# Patient Record
Sex: Female | Born: 1970 | Race: White | Hispanic: Yes | Marital: Married | Smoking: Never smoker
Health system: Southern US, Community
[De-identification: ages and names within clinical notes are randomized; demographics above are authoritative.]

---

## 2009-07-07 ENCOUNTER — Encounter: Payer: Self-pay | Admitting: Obstetrics & Gynecology

## 2009-07-07 ENCOUNTER — Ambulatory Visit: Payer: Self-pay | Admitting: Obstetrics & Gynecology

## 2009-07-07 ENCOUNTER — Inpatient Hospital Stay (HOSPITAL_COMMUNITY): Admission: AD | Admit: 2009-07-07 | Discharge: 2009-07-07 | Payer: Self-pay | Admitting: Obstetrics & Gynecology

## 2009-07-07 LAB — CONVERTED CEMR LAB
Rh Type: POSITIVE
hCG, Beta Chain, Quant, S: 11882.3 milliintl units/mL

## 2009-07-08 ENCOUNTER — Ambulatory Visit: Payer: Self-pay | Admitting: Obstetrics & Gynecology

## 2010-07-25 ENCOUNTER — Encounter: Payer: Self-pay | Admitting: Obstetrics & Gynecology

## 2010-09-19 LAB — CBC
HCT: 37.7 % (ref 36.0–46.0)
Hemoglobin: 12.5 g/dL (ref 12.0–15.0)
MCHC: 33.1 g/dL (ref 30.0–36.0)
MCV: 87.6 fL (ref 78.0–100.0)
RBC: 4.31 MIL/uL (ref 3.87–5.11)
WBC: 9.8 10*3/uL (ref 4.0–10.5)

## 2010-09-19 LAB — HCG, QUANTITATIVE, PREGNANCY: hCG, Beta Chain, Quant, S: 8461 m[IU]/mL — ABNORMAL HIGH (ref <5)

## 2010-11-16 NOTE — Assessment & Plan Note (Signed)
NAME:  Dorothy Ortega, Dorothy Ortega NO.:  [id number]   MEDICAL RECORD NO.:  [id number]          PATIENT TYPE:  POB   LOCATION:  [facility] at [place]         FACILITY:  [facility]   PHYSICIAN:  Allie Bossier, MD        DATE OF BIRTH:  [birth year]   DATE OF SERVICE:  07/08/2009                                  CLINIC NOTE   [name] is a 40 year old married white gravida 3, para 2, abortus 1, who  was seen here yesterday by Dr. Macon Large.  She was having bleeding in  early pregnancy.  HCG drawn here was 11,000.  She then went to the [facility]U  last night and was seen by Dr. Humberto Seals.  Quantitative beta drawn  there was 8000.  An ultrasound done last night showed a gestational sac  low in the uterine segment.  There was no embryo visualized.  She  understands that she is in a process of having a miscarriage.  I offered  her a watchful waiting, Cytotec, and/or a D and C.  At this time, she  elects to proceed with watchful waiting.  I have given her bleeding  precautions.  Her blood type is A+.  She will come back in 6 weeks for a  repeat quantitative beta HCG or sooner if necessary.  Since she may want  to have a pregnancy in the future, she is instructed to continue her  prenatal vitamins with folic acid and I have given her the reason behind  this.      Allie Bossier, MD     MCD/MEDQ  D:  07/08/2009  T:  07/08/2009  Job:  [id number]

## 2010-11-16 NOTE — Assessment & Plan Note (Signed)
Dorothy Ortega, Dorothy Ortega NO.:  [id number]   MEDICAL RECORD NO.:  [id number]          PATIENT TYPE:  POB   LOCATION:  [facility] at [place]         FACILITY:  [facility]   PHYSICIAN:  Jaynie Collins, MD     DATE OF BIRTH:  [birth year]   DATE OF SERVICE:  07/07/2009                                  CLINIC NOTE   CHIEF COMPLAINT:  Bleeding in early pregnancy.   HISTORY OF PRESENT ILLNESS:  The patient is a 40 year old gravida 3,  para 2-0-0-2 with last menstrual period of 05/15/2009, making her's  about 7 weeks and 3 days gestation who is here today with a complaint of  having some bleeding since yesterday.  The patient had a positive  pregnancy test at her primary care physician's office last week and had  no other symptoms until she started having bleeding yesterday evening.  She just reports passing dark brown mucus and some small clots, denies  passage of any tissue.  The patient also had some back pain and mild  abdominal pain that was associated with this bleeding, but currently has  no tenderness or any other concerns.   PHYSICAL EXAMINATION:  VITAL SIGNS:  Blood pressure is 131/72, weight is  158 pounds, height 68 inches.  GENERAL:  In no apparent distress.  ABDOMEN:  Soft, nontender, nondistended.  PELVIC:  Normal external female genitalia.  Pink, well rugated vagina.  The patient did have a small amount of blood and mucus in her posterior  fornix, which was cleared using about 3 Scopettes.  Her cervical os is  closed.  No active bleeding noted.  The patient is nontender on bimanual  examination.   Bedside ultrasound showed possible pregnancy in her uterus.  However,  using the focus and resolution on this ultrasound, it was unable to be  identified as a gestational sac versus a fetal pole as no distinct heart  beat could be visualized.  The patient will have to be sent for formal  ultrasonography.   ASSESSMENT/PLAN:  The patient is a 40 year old gravida 3, para  2, here  for her bleeding and early pregnancy.  The diagnosis of threatened  miscarriage.  The patient was told that it is very difficult to know at  this point based on her examination and ultrasound findings, however,  given  that she is going to have a formal ultrasound, we will follow up  on the results.  We will also check her blood type to see if she is a  candidate for RhoGAM and also check her beta HCG level.  Depending on  what is seen on a formal ultrasound, she might have to come back in 48  hours for another beta HCG level to see the trend.  The patient does  understand that if it is a miscarriage that is ongoing at this point  that there is no intervention that can be done.  She was told that we  will follow up on these results and act accordingly.  Bleeding  precautions were strictly reviewed.           ______________________________  Jaynie Collins, MD     UA/MEDQ  D:  07/07/2009  T:  07/08/2009  Job:  [id number]

## 2011-10-17 IMAGING — US US OB COMP LESS 14 WK
1 series · 14 of 28 positions shown · non-contrast
Comparison: None.

07/08/2009 - DUPLICATE COPY for exam association in [facility] – No change from original report.
CLINICAL DATA: Vaginal bleeding. 7-week-3-day gestational age by
 LMP.

 OBSTETRIC <14 WK US AND TRANSVAGINAL OB US
TECHNIQUE: Both transabdominal and transvaginal ultrasound
 examinations were performed for complete evaluation of the
 gestation as well as the maternal uterus, adnexal regions, and
 pelvic cul-de-sac.

[Series 1: us ob comp less 14 wks · 0.17mm/px · 14 of 39 slices shown]
[im 2/39]
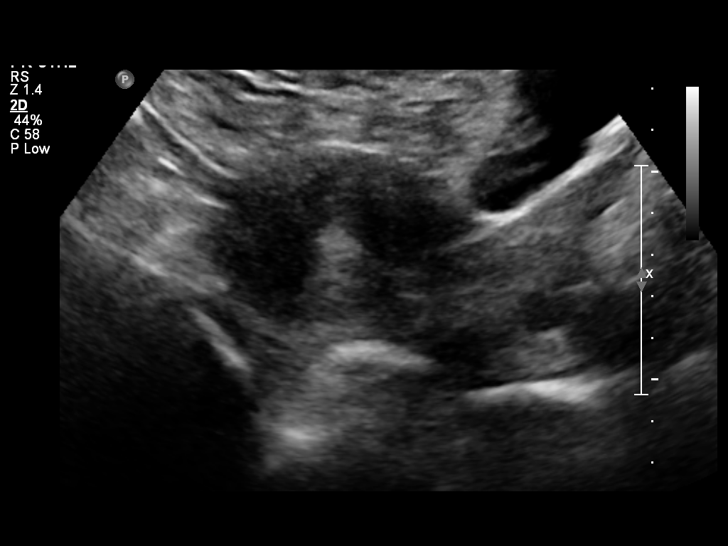
[im 5/39]
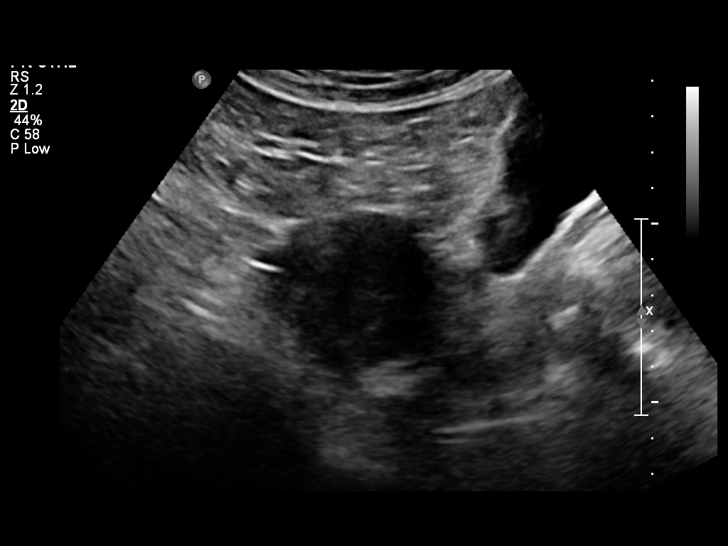
[im 8/39]
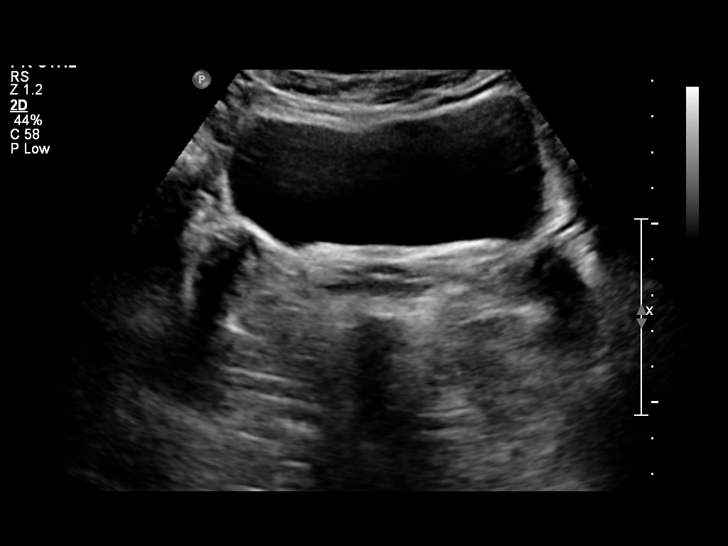
[im 10/39]
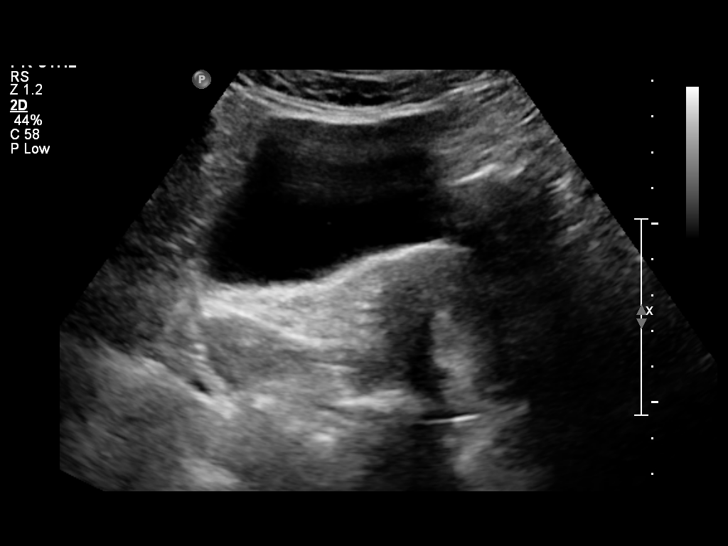
[im 13/39]
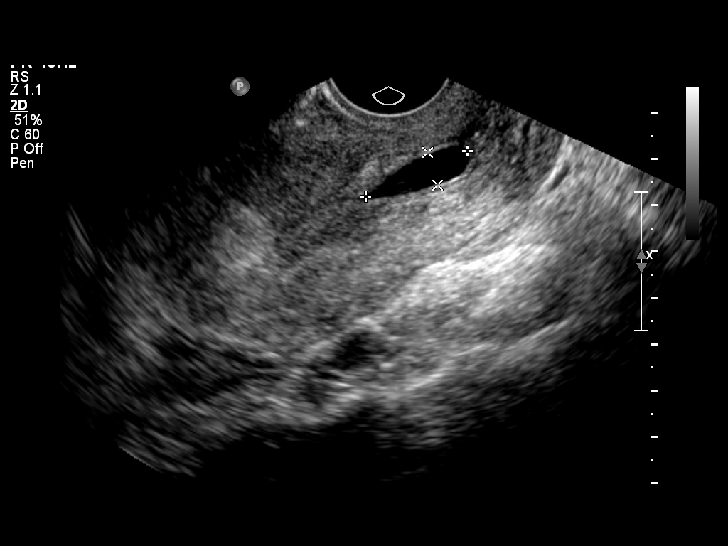
[im 16/39]
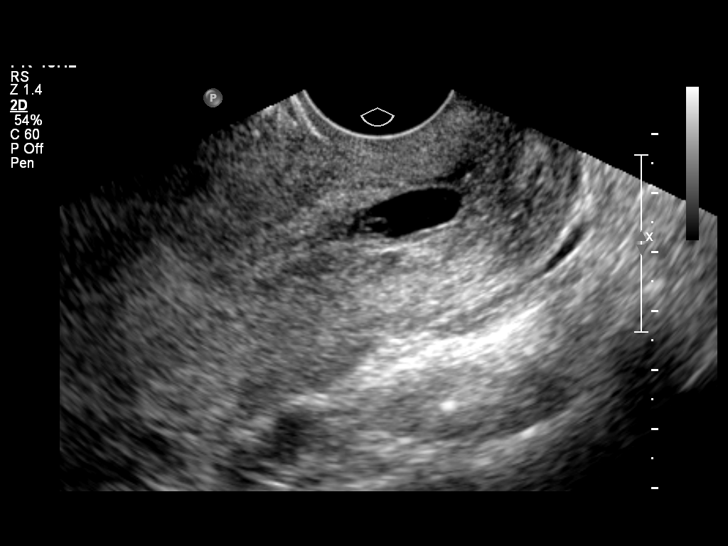
[im 19/39]
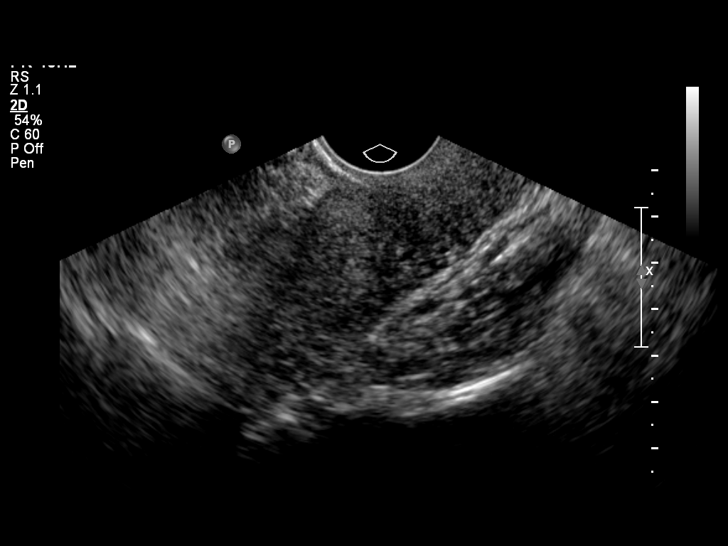
[im 22/39]
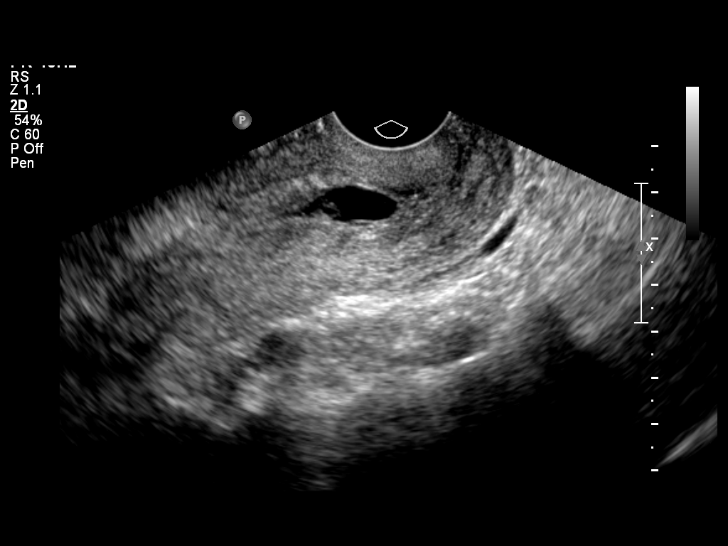
[im 24/39]
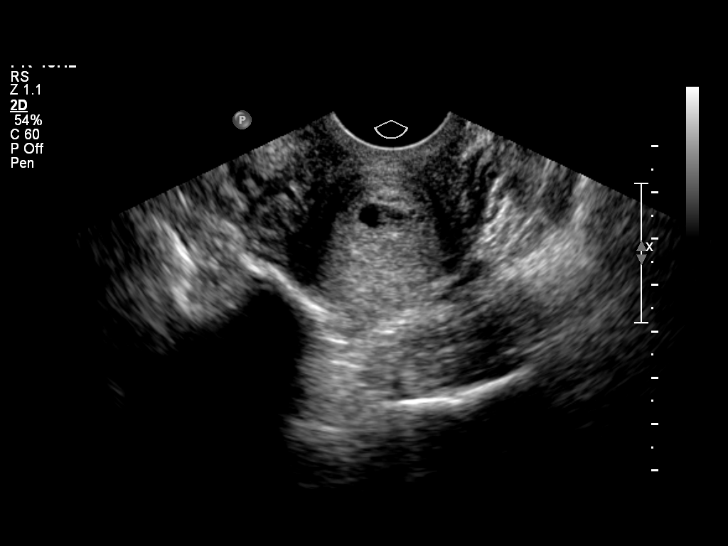
[im 27/39]
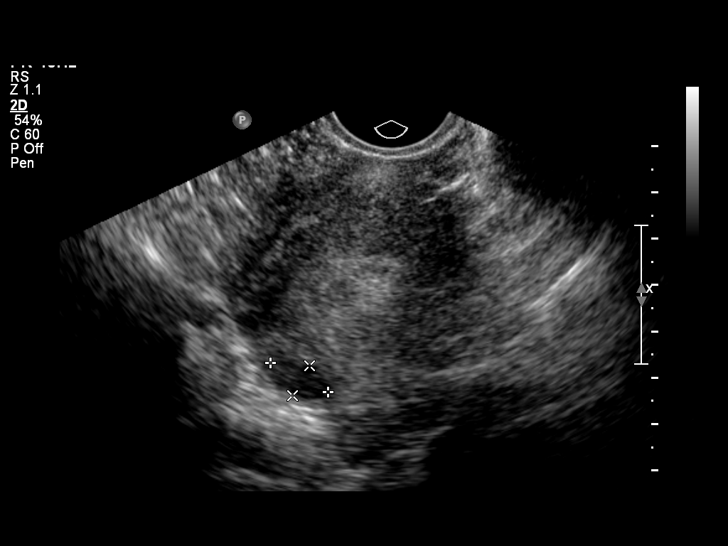
[im 30/39]
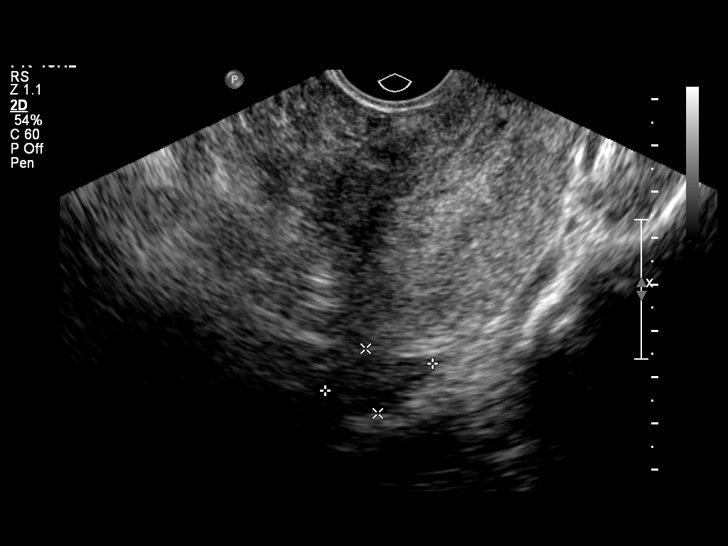
[im 33/39]
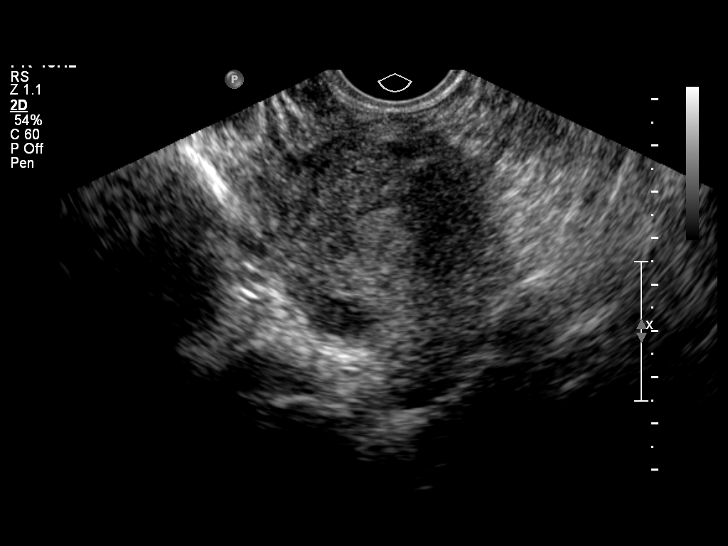
[im 36/39]
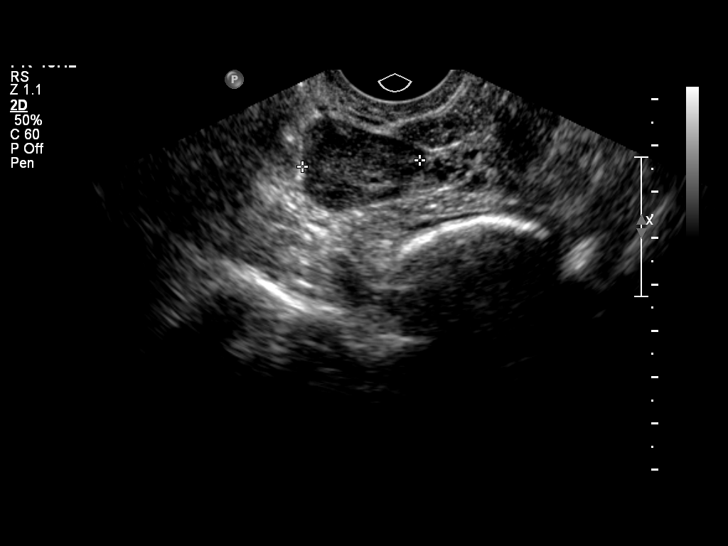
[im 39/39]
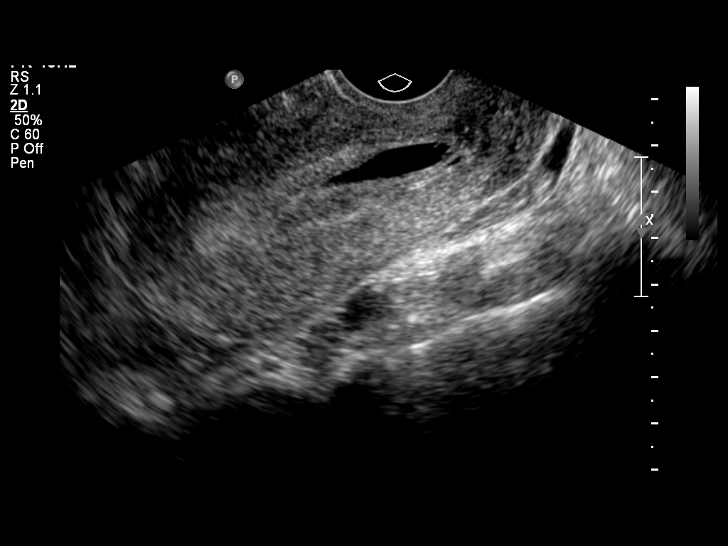

[14 of 28 positions shown; findings below may reference images not displayed]

Intrauterine gestational sac: Single sac located in the lower
 uterine segment
 Yolk sac: Visualized
 Embryo: Not visualized
 Cardiac Activity: Not visualized

 MSD: 15 mm 7 w 0 d

 Maternal uterus/adnexae:
 A single small fibroid is seen in the posterior fundus measuring
 1.4 cm in maximum diameter. Both ovaries are normal appearance.
 No evidence of adnexal mass or free fluid.
IMPRESSION: 1. Single 7-week intrauterine gestational sac in the lower uterine
 segment. Probable spontaneous abortion in progress. Recommend
 follow-up by ultrasound in 10 days if clinically warranted.
 2. 1.4 cm fundal fibroid.
 3. No evidence of adnexal mass or free fluid.

## 2016-08-05 DIAGNOSIS — L309 Dermatitis, unspecified: Secondary | ICD-10-CM | POA: Insufficient documentation

## 2017-11-09 ENCOUNTER — Ambulatory Visit: Payer: Self-pay | Admitting: Podiatry

## 2020-07-29 ENCOUNTER — Other Ambulatory Visit: Payer: Self-pay

## 2020-07-29 ENCOUNTER — Encounter: Payer: Self-pay | Admitting: Family Medicine

## 2020-07-29 ENCOUNTER — Ambulatory Visit (INDEPENDENT_AMBULATORY_CARE_PROVIDER_SITE_OTHER): Payer: 59 | Admitting: Family Medicine

## 2020-07-29 VITALS — BP 137/81 | HR 74 | Temp 97.4°F | Ht 68.0 in | Wt 170.0 lb

## 2020-07-29 DIAGNOSIS — Z13220 Encounter for screening for lipoid disorders: Secondary | ICD-10-CM

## 2020-07-29 DIAGNOSIS — Z8639 Personal history of other endocrine, nutritional and metabolic disease: Secondary | ICD-10-CM

## 2020-07-29 DIAGNOSIS — E559 Vitamin D deficiency, unspecified: Secondary | ICD-10-CM

## 2020-07-29 DIAGNOSIS — Z1211 Encounter for screening for malignant neoplasm of colon: Secondary | ICD-10-CM

## 2020-07-29 DIAGNOSIS — L659 Nonscarring hair loss, unspecified: Secondary | ICD-10-CM

## 2020-07-29 DIAGNOSIS — L309 Dermatitis, unspecified: Secondary | ICD-10-CM

## 2020-07-29 DIAGNOSIS — Z1239 Encounter for other screening for malignant neoplasm of breast: Secondary | ICD-10-CM

## 2020-07-29 NOTE — Patient Instructions (Signed)
Crisaborole topical ointment What is this medicine? CRISABOROLE Gaylyn Rong a BO role) is used topically to treat atopic dermatitis. This medicine may be used for other purposes; ask your health care provider or pharmacist if you have questions. COMMON BRAND NAME(S): EUCRISA What should I tell my health care provider before I take this medicine? They need to know if you have any of these conditions:  an unusual or allergic reaction to crisaborole, other medicines, foods, dyes, or preservatives  pregnant or trying to get pregnant  breast-feeding How should I use this medicine? This medicine is for external use only. Do not take by mouth. Follow the direction on the prescription label. Wash your hands before and after use. Apply a thin film to the affected areas twice a day, or as often as directed by your doctor or health care professional. Rub the ointment in thoroughly. Do not use your medicine more often than directed. Talk to your pediatrician regarding the use of this medicine in children. While this drug may be prescribed for children as young as 74 months of age for selected conditions, precautions do apply. Overdosage: If you think you have taken too much of this medicine contact a poison control center or emergency room at once. NOTE: This medicine is only for you. Do not share this medicine with others. What if I miss a dose? If you miss a dose, use it as soon as you can. If it is almost time for your next dose, use only that dose. Do not use double or extra doses. What may interact with this medicine? Interactions are not expected. Do not use any other skin products on the affected area without asking your doctor or health care professional. This list may not describe all possible interactions. Give your health care provider a list of all the medicines, herbs, non-prescription drugs, or dietary supplements you use. Also tell them if you smoke, drink alcohol, or use illegal drugs. Some items  may interact with your medicine. What should I watch for while using this medicine? Do not get this medicine in your eyes. If you do, rinse out with plenty of cool tap water. Tell your doctor or health care professional if your symptoms do not start to get better or if they get worse. What side effects may I notice from receiving this medicine? Side effects that you should report to your doctor or health care professional as soon as possible:  allergic reactions like skin rash, itching or hives, swelling of the face, lips, or tongue Side effects that usually do not require medical attention (report to your doctor or health care professional if they continue or are bothersome):  hives  stinging or burning This list may not describe all possible side effects. Call your doctor for medical advice about side effects. You may report side effects to FDA at 1-800-FDA-1088. Where should I keep my medicine? Keep out of the reach of children. Store at room temperature between 20 and 25 degrees C (68 and 77 degrees F). Keep this medicine in the original container. Throw away any unused medicine after the expiration date. NOTE: This sheet is a summary. It may not cover all possible information. If you have questions about this medicine, talk to your doctor, pharmacist, or health care provider.  2021 Elsevier/Gold Standard (2018-09-25 14:27:54)

## 2020-07-30 ENCOUNTER — Telehealth: Payer: Self-pay | Admitting: Family Medicine

## 2020-07-30 NOTE — Telephone Encounter (Signed)
Pt decline to get covid vaccine at this time 

## 2020-08-03 ENCOUNTER — Encounter: Payer: Self-pay | Admitting: Family Medicine

## 2020-08-03 DIAGNOSIS — Z1211 Encounter for screening for malignant neoplasm of colon: Secondary | ICD-10-CM | POA: Insufficient documentation

## 2020-08-03 DIAGNOSIS — Z1239 Encounter for other screening for malignant neoplasm of breast: Secondary | ICD-10-CM | POA: Insufficient documentation

## 2020-08-03 DIAGNOSIS — L659 Nonscarring hair loss, unspecified: Secondary | ICD-10-CM | POA: Insufficient documentation

## 2020-08-03 NOTE — Assessment & Plan Note (Signed)
Screening mammogram ordered

## 2020-08-03 NOTE — Assessment & Plan Note (Signed)
Cologuard ordered

## 2020-08-03 NOTE — Assessment & Plan Note (Signed)
Discussed alternatives to steroid treatment for management of her eczema.  She will continue to use moisturizer daily.  She was given information on [place], she will let me know if she would like to try this.

## 2020-08-03 NOTE — Progress Notes (Signed)
Dorothy Ortega - 50 y.o. female MRN [id number]  Date of birth: 02/21/1971  Subjective Chief Complaint  Patient presents with  . Establish Care    HPI Dorothy Ortega is a 50 year old female here today for initial visit to establish care.  She has been pretty healthy.  She does have a history of eczema.  She has seen dermatology in the past for eczema.  She has tried multiple steroid creams but was concerned about skin thinning associated with this.  She does use topical moisturizers as well as hydrocortisone cream as needed.  She has not tried any other topical such as [place].  She would also like to have updated lab work today.  She reports a history of iron deficiency anemia.  Is been about 3 to 4 years since she has had labs checked.  She has having some hair loss and fatigue and would like to have TSH checked as well.  She denies palpitations or shortness of breath.  She is also due for colon cancer screening as well as mammogram.  She declines vaccination for influenza at this time.  ROS:  A comprehensive ROS was completed and negative except as noted per HPI  No Known Allergies  History reviewed. No pertinent past medical history.  History reviewed. No pertinent surgical history.  Social History   Socioeconomic History  . Marital status: Married    Spouse name: Not on file  . Number of children: Not on file  . Years of education: Not on file  . Highest education level: Not on file  Occupational History  . Not on file  Tobacco Use  . Smoking status: Never Smoker  . Smokeless tobacco: Never Used  Vaping Use  . Vaping Use: Never used  Substance and Sexual Activity  . Alcohol use: Yes    Alcohol/week: 1.0 - 2.0 standard drink    Types: 1 - 2 Standard drinks or equivalent per week    Comment: yearly  . Drug use: Never  . Sexual activity: Yes    Partners: Male  Other Topics Concern  . Not on file  Social History Narrative  . Not on file   Social Determinants of Health    Financial Resource Strain: Not on file  Food Insecurity: Not on file  Transportation Needs: Not on file  Physical Activity: Not on file  Stress: Not on file  Social Connections: Not on file    Family History  Problem Relation Age of Onset  . Hypertension Mother     Health Maintenance  Topic Date Due  . Hepatitis C Screening  Never done  . HIV Screening  Never done  . TETANUS/TDAP  Never done  . PAP SMEAR-Modifier  Never done  . COLONOSCOPY (Pts 45-49yrs Insurance coverage will need to be confirmed)  Never done  . MAMMOGRAM  Never done  . COVID-19 Vaccine (3 - Booster for Pfizer series) 07/24/2020  . INFLUENZA VACCINE  10/01/2020 (Originally 02/02/2020)     ----------------------------------------------------------------------------------------------------------------------------------------------------------------------------------------------------------------- Physical Exam BP 137/81 (BP Location: Left Arm, Patient Position: Sitting, Cuff Size: Normal)   Pulse 74   Temp (!) 97.4 F (36.3 C) (Oral)   Ht 5\' 8"  (1.727 m)   Wt 170 lb (77.1 kg)   SpO2 100%   BMI 25.85 kg/m   Physical Exam Constitutional:      Appearance: Normal appearance.  HENT:     Head: Normocephalic and atraumatic.  Eyes:     General: No scleral icterus. Cardiovascular:     Rate and  Rhythm: Normal rate and regular rhythm.  Skin:    General: Skin is warm and dry.  Neurological:     General: No focal deficit present.     Mental Status: She is alert.  Psychiatric:        Mood and Affect: Mood normal.        Behavior: Behavior normal.     ------------------------------------------------------------------------------------------------------------------------------------------------------------------------------------------------------------------- Assessment and Plan  Eczema Discussed alternatives to steroid treatment for management of her eczema.  She will continue to use moisturizer  daily.  She was given information on [place], she will let me know if she would like to try this.  Hair loss Check TSH as well as iron panel.  Encounter for screening for malignant neoplasm of breast Screening mammogram ordered.  Screening for colon cancer Cologuard ordered.   No orders of the defined types were placed in this encounter.   No follow-ups on file.    This visit occurred during the SARS-CoV-2 public health emergency.  Safety protocols were in place, including screening questions prior to the visit, additional usage of staff PPE, and extensive cleaning of exam room while observing appropriate contact time as indicated for disinfecting solutions.

## 2020-08-03 NOTE — Assessment & Plan Note (Signed)
Check TSH as well as iron panel.

## 2020-08-11 LAB — CBC: WBC: 5.2 10*3/uL (ref 3.8–10.8)

## 2020-08-12 LAB — IRON,TIBC AND FERRITIN PANEL
%SAT: 29 % (calc) (ref 16–45)
Ferritin: 48 ng/mL (ref 16–232)
Iron: 131 ug/dL (ref 45–160)
TIBC: 450 mcg/dL (calc) (ref 250–450)

## 2020-08-12 LAB — CBC
HCT: 43 % (ref 35.0–45.0)
Hemoglobin: 14.2 g/dL (ref 11.7–15.5)
MCH: 28.1 pg (ref 27.0–33.0)
MCHC: 33 g/dL (ref 32.0–36.0)
MCV: 85.1 fL (ref 80.0–100.0)
MPV: 9.9 fL (ref 7.5–12.5)
Platelets: 394 10*3/uL (ref 140–400)
RBC: 5.05 10*6/uL (ref 3.80–5.10)
RDW: 13.1 % (ref 11.0–15.0)

## 2020-08-12 LAB — COMPLETE METABOLIC PANEL WITH GFR
AG Ratio: 1.3 (calc) (ref 1.0–2.5)
ALT: 16 U/L (ref 6–29)
AST: 17 U/L (ref 10–35)
Albumin: 4.4 g/dL (ref 3.6–5.1)
Alkaline phosphatase (APISO): 86 U/L (ref 37–153)
BUN: 14 mg/dL (ref 7–25)
CO2: 29 mmol/L (ref 20–32)
Calcium: 10.1 mg/dL (ref 8.6–10.4)
Chloride: 105 mmol/L (ref 98–110)
Creat: 0.75 mg/dL (ref 0.50–1.05)
GFR, Est African American: 106 mL/min/{1.73_m2} (ref >=60)
GFR, Est Non African American: 92 mL/min/{1.73_m2} (ref >=60)
Globulin: 3.3 g/dL (calc) (ref 1.9–3.7)
Glucose, Bld: 85 mg/dL (ref 65–99)
Potassium: 4.3 mmol/L (ref 3.5–5.3)
Sodium: 142 mmol/L (ref 135–146)
Total Bilirubin: 0.7 mg/dL (ref 0.2–1.2)
Total Protein: 7.7 g/dL (ref 6.1–8.1)

## 2020-08-12 LAB — VITAMIN D 25 HYDROXY (VIT D DEFICIENCY, FRACTURES): Vit D, 25-Hydroxy: 19 ng/mL — ABNORMAL LOW (ref 30–100)

## 2020-08-12 LAB — TSH: TSH: 2.11 mIU/L

## 2020-08-12 LAB — LIPID PANEL
Cholesterol: 193 mg/dL (ref <200)
HDL: 60 mg/dL (ref >=50)
LDL Cholesterol (Calc): 111 mg/dL (calc) — ABNORMAL HIGH
Non-HDL Cholesterol (Calc): 133 mg/dL (calc) — ABNORMAL HIGH (ref <130)
Total CHOL/HDL Ratio: 3.2 (calc) (ref <5.0)
Triglycerides: 111 mg/dL (ref <150)

## 2020-08-12 LAB — COLOGUARD: Cologuard: NEGATIVE

## 2020-08-13 ENCOUNTER — Encounter: Payer: Self-pay | Admitting: Family Medicine

## 2020-08-13 ENCOUNTER — Other Ambulatory Visit: Payer: Self-pay

## 2020-08-13 DIAGNOSIS — E559 Vitamin D deficiency, unspecified: Secondary | ICD-10-CM

## 2020-08-13 MED ORDER — VITAMIN D (ERGOCALCIFEROL) 1.25 MG (50000 UNIT) PO CAPS: 50000.0000 [IU] | ORAL_CAPSULE | ORAL | 0 refills | Status: AC

## 2020-08-14 ENCOUNTER — Encounter: Payer: Self-pay | Admitting: Family Medicine

## 2020-08-24 ENCOUNTER — Encounter: Payer: Self-pay | Admitting: Family Medicine

## 2020-08-24 ENCOUNTER — Telehealth: Payer: Self-pay

## 2020-08-24 NOTE — Telephone Encounter (Signed)
Pt lvm stating she would like ENT referral for her throat.   Forwarded msg to Dr. Ashley Royalty.

## 2020-08-25 ENCOUNTER — Other Ambulatory Visit: Payer: Self-pay | Admitting: Family Medicine

## 2020-08-25 DIAGNOSIS — R6889 Other general symptoms and signs: Secondary | ICD-10-CM

## 2020-08-25 DIAGNOSIS — R0989 Other specified symptoms and signs involving the circulatory and respiratory systems: Secondary | ICD-10-CM

## 2020-08-25 NOTE — Telephone Encounter (Signed)
Referral entered  

## 2020-08-26 ENCOUNTER — Telehealth: Payer: Self-pay

## 2020-08-26 ENCOUNTER — Encounter: Payer: Self-pay | Admitting: Family Medicine

## 2020-08-26 NOTE — Telephone Encounter (Signed)
Pt states she has been attempting to schedule with a psychologist concerning her anxiety. They are unable to schedule without referral.   Requesting a psychologist that takes her insurance be sent a referral on her behalf.   Dorothy Ortega states she is not in crisis but is having trouble controlling her anxiety.

## 2020-08-26 NOTE — Telephone Encounter (Signed)
Pt has been advised.

## 2020-08-27 ENCOUNTER — Other Ambulatory Visit: Payer: Self-pay | Admitting: Family Medicine

## 2020-08-27 DIAGNOSIS — F411 Generalized anxiety disorder: Secondary | ICD-10-CM

## 2020-08-27 NOTE — Telephone Encounter (Signed)
Referral entered  

## 2020-08-28 NOTE — Telephone Encounter (Signed)
Referral sent and they have reached out to her once already - CF

## 2020-11-09 ENCOUNTER — Other Ambulatory Visit: Payer: Self-pay | Admitting: Family Medicine

## 2020-11-09 ENCOUNTER — Telehealth: Payer: Self-pay

## 2020-11-09 DIAGNOSIS — E559 Vitamin D deficiency, unspecified: Secondary | ICD-10-CM

## 2020-11-09 NOTE — Telephone Encounter (Signed)
Patient has completed vitamin D therapy.   Advised lab recheck orders have been placed.

## 2020-11-10 LAB — VITAMIN D 25 HYDROXY (VIT D DEFICIENCY, FRACTURES): Vit D, 25-Hydroxy: 44 ng/mL (ref 30–100)

## 2020-11-20 ENCOUNTER — Encounter: Payer: Self-pay | Admitting: Podiatry

## 2020-11-20 ENCOUNTER — Ambulatory Visit (INDEPENDENT_AMBULATORY_CARE_PROVIDER_SITE_OTHER): Payer: 59 | Admitting: Podiatry

## 2020-11-20 ENCOUNTER — Other Ambulatory Visit: Payer: Self-pay

## 2020-11-20 DIAGNOSIS — M2141 Flat foot [pes planus] (acquired), right foot: Secondary | ICD-10-CM | POA: Diagnosis not present

## 2020-11-20 DIAGNOSIS — M2041 Other hammer toe(s) (acquired), right foot: Secondary | ICD-10-CM

## 2020-11-20 DIAGNOSIS — L600 Ingrowing nail: Secondary | ICD-10-CM

## 2020-11-20 DIAGNOSIS — M2042 Other hammer toe(s) (acquired), left foot: Secondary | ICD-10-CM

## 2020-11-20 DIAGNOSIS — L84 Corns and callosities: Secondary | ICD-10-CM

## 2020-11-20 DIAGNOSIS — M2142 Flat foot [pes planus] (acquired), left foot: Secondary | ICD-10-CM

## 2020-11-20 DIAGNOSIS — M21612 Bunion of left foot: Secondary | ICD-10-CM

## 2020-11-20 NOTE — Patient Instructions (Signed)
The procedure we talked about is a "removal of ingrown toenail, partial, with phenol".

## 2020-11-20 NOTE — Progress Notes (Signed)
Subjective:   Patient ID: Dorothy Ortega, female   DOB: 50 y.o.   MRN: [id number]   HPI 50 year old female presents the office today for concerns of possible ingrown toenails to her right second and fifth digit nails.  This been a chronic issue for years and she trims the nails herself.  She states when she trimmed and the bleeding times.  No swelling or redness.  Also she has had some discomfort for her left big toe for about a year.  This seems to be more aggravated with walking.  No recent injury to both feet.  No increase swelling.  No other concerns.   Review of Systems  All other systems reviewed and are negative.  History reviewed. No pertinent past medical history.  History reviewed. No pertinent surgical history.  No current outpatient medications on file.  No Known Allergies      Objective:  Physical Exam  General: AAO x3, NAD  Dermatological: Mild incurvation present along the medial aspect the right second digit toenail consistent with ingrown nail.  No edema, erythema.  No significant incurvation of the right fifth digit lateral nail border at this time but subjectively she does get discomfort.  Hyperkeratotic lesions medial hallux bilaterally without any underlying ulceration drainage or signs of infection.  Left side worse than right for this.  No other areas of skin breakdown or preulcerative areas noted.   Vascular: Dorsalis Pedis artery and Posterior Tibial artery pedal pulses are 2/4 bilateral with immedate capillary fill time.  There is no pain with calf compression, swelling, warmth, erythema.   Neruologic: Grossly intact via light touch bilateral.   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  Adductovarus present fifth toe.  Mild bunions present.  Subjectively she has tenderness on the medial IPJ of the hallux on the left side along the area of the callus.  No area of pinpoint tenderness.  Muscular strength 5/5 in all groups tested  bilateral.  Decreased medial arch height.  Gait: Unassisted, Nonantalgic.       Assessment:   Ingrown toenails, digital deformity, bunion     Plan:  -Treatment options discussed including all alternatives, risks, and complications -Etiology of symptoms were discussed -Regards to the toenails I discussed with her partial nail avulsion with chemical matricectomy but she wished to hold off on this.  As a courtesy debride the nail plate and complications.  Dispensed offloading pads as I do think that the digital deformity is causing the symptoms as well.  Discussed proper shoe gear fit. -As a courtesy debride the callus on the hallux.  Offloading pads again discussed shoe modifications. -Due to flatfeet discussed arch supports.  Vivi Barrack DPM

## 2021-03-16 ENCOUNTER — Other Ambulatory Visit: Payer: Self-pay

## 2021-03-16 ENCOUNTER — Ambulatory Visit (INDEPENDENT_AMBULATORY_CARE_PROVIDER_SITE_OTHER): Payer: 59 | Admitting: Family Medicine

## 2021-03-16 ENCOUNTER — Encounter: Payer: Self-pay | Admitting: Family Medicine

## 2021-03-16 VITALS — BP 118/54 | HR 87 | Temp 98.0°F | Ht 68.0 in | Wt 173.0 lb

## 2021-03-16 DIAGNOSIS — M25541 Pain in joints of right hand: Secondary | ICD-10-CM

## 2021-03-16 DIAGNOSIS — L309 Dermatitis, unspecified: Secondary | ICD-10-CM | POA: Diagnosis not present

## 2021-03-16 DIAGNOSIS — R229 Localized swelling, mass and lump, unspecified: Secondary | ICD-10-CM

## 2021-03-16 DIAGNOSIS — L659 Nonscarring hair loss, unspecified: Secondary | ICD-10-CM

## 2021-03-16 DIAGNOSIS — M25542 Pain in joints of left hand: Secondary | ICD-10-CM

## 2021-03-16 MED ORDER — PREDNISONE 10 MG (21) PO TBPK: ORAL_TABLET | ORAL | 0 refills | Status: DC

## 2021-03-16 MED ORDER — HYDROCORTISONE VALERATE 0.2 % EX CREA: 1.0000 "application " | TOPICAL_CREAM | Freq: Two times a day (BID) | CUTANEOUS | 1 refills | Status: AC

## 2021-03-16 NOTE — Assessment & Plan Note (Signed)
Continues to have hair loss.  Rechecking TSH.  We discussed trying biotin supplement as well.

## 2021-03-16 NOTE — Progress Notes (Signed)
Dorothy Ortega - 50 y.o. female MRN [id number]  Date of birth: 16-Apr-1971  Subjective Chief Complaint  Patient presents with   Ear Problem    Lump behind left ear, first noticed 2 weeks ago, denied pain    HPI Dorothy Ortega is a 50 year old female here today with complaint of lump behind her left ear, hair loss and joint pain.  She noticed nodule behind her left ear approximately 1-1/2 weeks ago.    Area is not painful.  She denies ear pain.  She has not had any recent illness however she has had increased symptoms related to allergies over the past couple of weeks.  She has not really tried anything for management of her allergies.  In addition to her allergies she has noticed increased eczema flare.  This is located on all extremities and her trunk as well as some areas on her face.  She does use hydrocortisone cream as needed for this.  She does need a refill of this.  She also complains of bilateral pain with occasional swelling in her wrists.  This is been ongoing for several weeks off and on.  There is no numbness or tingling.  She has not tried anything to help with this so far.  ROS:  A comprehensive ROS was completed and negative except as noted per HPI  No Known Allergies  History reviewed. No pertinent past medical history.  History reviewed. No pertinent surgical history.  Social History   Socioeconomic History   Marital status: Married    Spouse name: Not on file   Number of children: Not on file   Years of education: Not on file   Highest education level: Not on file  Occupational History   Not on file  Tobacco Use   Smoking status: Never   Smokeless tobacco: Never  Vaping Use   Vaping Use: Never used  Substance and Sexual Activity   Alcohol use: Yes    Alcohol/week: 1.0 - 2.0 standard drink    Types: 1 - 2 Standard drinks or equivalent per week    Comment: yearly   Drug use: Never   Sexual activity: Yes    Partners: Male  Other Topics Concern   Not on file   Social History Narrative   Not on file   Social Determinants of Health   Financial Resource Strain: Not on file  Food Insecurity: Not on file  Transportation Needs: Not on file  Physical Activity: Not on file  Stress: Not on file  Social Connections: Not on file    Family History  Problem Relation Age of Onset   Hypertension Mother     Health Maintenance  Topic Date Due   MAMMOGRAM  Never done   PAP SMEAR-Modifier  03/16/2021 (Originally 07/19/1989)   COVID-19 Vaccine (3 - Booster for [place] series) 04/01/2021 (Originally 06/23/2020)   Zoster Vaccines- Shingrix (1 of 2) 06/15/2021 (Originally 07/19/2018)   INFLUENZA VACCINE  10/01/2021 (Originally 02/01/2021)   TETANUS/TDAP  03/16/2022 (Originally 07/20/1987)   Hepatitis C Screening  03/16/2022 (Originally 07/19/1986)   HIV Screening  03/16/2022 (Originally 07/20/1983)   Fecal DNA (Cologuard)  08/13/2023   Pneumococcal Vaccine 46-62 Years old  Aged Out   HPV VACCINES  Aged Out     ----------------------------------------------------------------------------------------------------------------------------------------------------------------------------------------------------------------- Physical Exam BP (!) 118/54   Pulse 87   Temp 98 F (36.7 C)   Ht $R'5\' 8"'VD$  (1.727 m)   Wt 173 lb (78.5 kg)   SpO2 100%   BMI 26.30 kg/m  Physical Exam Constitutional:      Appearance: Normal appearance.  Eyes:     General: No scleral icterus. Cardiovascular:     Rate and Rhythm: Normal rate and regular rhythm.  Pulmonary:     Effort: Pulmonary effort is normal.     Breath sounds: Normal breath sounds.  Musculoskeletal:     Cervical back: Neck supple.     Comments: Tenderness to palpation on bilateral wrists around radial aspect of wrist.  No significant swelling or synovitis noted.  Mild pain with Wynn Maudlin  Skin:    Comments: Dry, scaly eczematous patches noted along extremities and trunk.  Small nodule posterior to left ear.   This is firm and mobile.  Area is nontender.  Neurological:     General: No focal deficit present.     Mental Status: She is alert.  Psychiatric:        Mood and Affect: Mood normal.        Behavior: Behavior normal.    ------------------------------------------------------------------------------------------------------------------------------------------------------------------------------------------------------------------- Assessment and Plan  Eczema Prescription for topical hydrocortisone renewed.  She will also continue CeraVe moisturizer.   Hair loss Continues to have hair loss.  Rechecking TSH.  We discussed trying biotin supplement as well.  Arthralgia of both hands Checking ESR, ANA and rheumatoid factor.  Skin nodule This may be an enlarged lymph node related to her allergies versus a small cyst.  Does not seem to be bothering her and appears to be benign.  Will observe for now.   Meds ordered this encounter  Medications   predniSONE (STERAPRED UNI-PAK 21 TAB) 10 MG (21) TBPK tablet    Sig: Taper as directed on packaging.    Dispense:  21 tablet    Refill:  0   hydrocortisone valerate cream (WESTCORT) 0.2 %    Sig: Apply 1 application topically 2 (two) times daily.    Dispense:  60 g    Refill:  1    No follow-ups on file.    This visit occurred during the SARS-CoV-2 public health emergency.  Safety protocols were in place, including screening questions prior to the visit, additional usage of staff PPE, and extensive cleaning of exam room while observing appropriate contact time as indicated for disinfecting solutions.

## 2021-03-16 NOTE — Patient Instructions (Signed)
I think the area behind the ear is a lymph node or a small cyst.   Take cetirizine and flonase daily for allergy symptoms.   Start prednisone taper to help with eczema.  Continue cerave and topical steroid cream.   Try topical voltaren gel to thumbs to help with pain.

## 2021-03-16 NOTE — Assessment & Plan Note (Signed)
This may be an enlarged lymph node related to her allergies versus a small cyst.  Does not seem to be bothering her and appears to be benign.  Will observe for now.

## 2021-03-16 NOTE — Assessment & Plan Note (Signed)
Prescription for topical hydrocortisone renewed.  She will also continue CeraVe moisturizer.

## 2021-03-16 NOTE — Assessment & Plan Note (Signed)
Checking ESR, ANA and rheumatoid factor.

## 2021-03-18 LAB — ANA,IFA RA DIAG PNL W/RFLX TIT/PATN
Anti Nuclear Antibody (ANA): NEGATIVE
Cyclic Citrullin Peptide Ab: <16 UNITS
Rhuematoid fact SerPl-aCnc: <14 IU/mL (ref <14)

## 2021-03-18 LAB — SEDIMENTATION RATE: Sed Rate: 9 mm/h (ref 0–30)

## 2021-03-18 LAB — TSH: TSH: 1.77 mIU/L

## 2021-09-07 ENCOUNTER — Ambulatory Visit: Payer: BLUE CROSS/BLUE SHIELD | Admitting: Family Medicine

## 2021-09-07 ENCOUNTER — Other Ambulatory Visit: Payer: Self-pay

## 2021-09-07 ENCOUNTER — Encounter: Payer: Self-pay | Admitting: Family Medicine

## 2021-09-07 DIAGNOSIS — M25562 Pain in left knee: Secondary | ICD-10-CM | POA: Diagnosis not present

## 2021-09-07 DIAGNOSIS — L309 Dermatitis, unspecified: Secondary | ICD-10-CM | POA: Diagnosis not present

## 2021-09-07 MED ORDER — TRIAMCINOLONE ACETONIDE 0.1 % EX CREA: 1.0000 "application " | TOPICAL_CREAM | Freq: Two times a day (BID) | CUTANEOUS | 1 refills | Status: DC

## 2021-09-07 NOTE — Patient Instructions (Signed)
Try triamcinolone cream to legs.  ? ?

## 2021-09-07 NOTE — Assessment & Plan Note (Signed)
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  There is no instability of the knee or pain with joint testing.  No significant tenderness along the tibial tubercle at this time. ?

## 2021-09-07 NOTE — Assessment & Plan Note (Signed)
Continue CeraVe frequently throughout the day.  Adding triamcinolone as needed.  She will let me know if this is not effective. ?

## 2021-09-07 NOTE — Progress Notes (Signed)
?Dorothy Ortega - 51 y.o. female MRN [id number]  Date of birth: 02/22/1971 ? ?Subjective ?Chief Complaint  ?Patient presents with  ? Knee Pain  ? Eczema  ? ? ?HPI ?Dorothy Ortega is a 50 year old female here today with complaint of left knee pain as well as eczema.  She has had left knee pain since experiencing a fall a couple weeks ago.  It does seem to be getting better but she has some tenderness when kneeling or putting pressure on the lower knee.  She denies any swelling or significant bruising at this time.  She has no pain with weightbearing.  She does have concerned about a knot on her lower knee. ? ?She has had some worsening of her eczema.  Currently using over-the-counter hydrocortisone cream with some improvement. ? ?ROS:  A comprehensive ROS was completed and negative except as noted per HPI ? ?No Known Allergies ? ?History reviewed. No pertinent past medical history. ? ?History reviewed. No pertinent surgical history. ? ?Social History  ? ?Socioeconomic History  ? Marital status: Married  ?  Spouse name: Not on file  ? Number of children: Not on file  ? Years of education: Not on file  ? Highest education level: Not on file  ?Occupational History  ? Not on file  ?Tobacco Use  ? Smoking status: Never  ? Smokeless tobacco: Never  ?Vaping Use  ? Vaping Use: Never used  ?Substance and Sexual Activity  ? Alcohol use: Yes  ?  Alcohol/week: 1.0 - 2.0 standard drink  ?  Types: 1 - 2 Standard drinks or equivalent per week  ?  Comment: yearly  ? Drug use: Never  ? Sexual activity: Yes  ?  Partners: Male  ?Other Topics Concern  ? Not on file  ?Social History Narrative  ? Not on file  ? ?Social Determinants of Health  ? ?Financial Resource Strain: Not on file  ?Food Insecurity: Not on file  ?Transportation Needs: Not on file  ?Physical Activity: Not on file  ?Stress: Not on file  ?Social Connections: Not on file  ? ? ?Family History  ?Problem Relation Age of Onset  ? Hypertension Mother   ? ? ?Health Maintenance  ?Topic  Date Due  ? PAP SMEAR-Modifier  Never done  ? MAMMOGRAM  Never done  ? Zoster Vaccines- Shingrix (1 of 2) Never done  ? COVID-19 Vaccine (3 - Booster for Pfizer series) 03/18/2020  ? INFLUENZA VACCINE  10/01/2021 (Originally 02/01/2021)  ? TETANUS/TDAP  03/16/2022 (Originally 07/20/1987)  ? Hepatitis C Screening  03/16/2022 (Originally 07/19/1986)  ? HIV Screening  03/16/2022 (Originally 07/20/1983)  ? Fecal DNA (Cologuard)  08/13/2023  ? HPV VACCINES  Aged Out  ? ? ? ?----------------------------------------------------------------------------------------------------------------------------------------------------------------------------------------------------------------- ?Physical Exam ?BP 118/67 (BP Location: Left Arm, Patient Position: Sitting, Cuff Size: Large)   Pulse 74   Temp (!) 97.5 ?F (36.4 ?C)   Ht 5\' 8"  (1.727 m)   Wt 177 lb (80.3 kg)   SpO2 97%   BMI 26.91 kg/m?  ? ?Physical Exam ?Constitutional:   ?   Appearance: Normal appearance.  ?Musculoskeletal:  ?   Cervical back: Neck supple.  ?   Comments: Left knee is normal to inspection and palpation.  There is no effusion or significant swelling along the surrounding structures.  She does have a prominent tibial tubercle bilaterally.  There is noted bruising noted.  Range of motion is normal.  No instability or laxity of the ligaments.  ?Skin: ?   General: Skin is  warm.  ?   Findings: Rash (Dry, scaly hypertrophic patches scattered along bilateral legs.  There is excoriation of some of these areas without signs of infection.) present.  ?Neurological:  ?   Mental Status: She is alert.  ? ? ?------------------------------------------------------------------------------------------------------------------------------------------------------------------------------------------------------------------- ?Assessment and Plan ? ?No problem-specific Assessment & Plan notes found for this encounter. ? ? ?Meds ordered this encounter  ?Medications  ? triamcinolone  cream (KENALOG) 0.1 %  ?  Sig: Apply 1 application. topically 2 (two) times daily.  ?  Dispense:  454 g  ?  Refill:  1  ? ? ?No follow-ups on file. ? ? ? ?This visit occurred during the SARS-CoV-2 public health emergency.  Safety protocols were in place, including screening questions prior to the visit, additional usage of staff PPE, and extensive cleaning of exam room while observing appropriate contact time as indicated for disinfecting solutions.  ? ?

## 2021-09-13 ENCOUNTER — Encounter: Payer: Self-pay | Admitting: Family Medicine

## 2021-09-28 ENCOUNTER — Other Ambulatory Visit: Payer: Self-pay | Admitting: Family Medicine

## 2021-09-28 ENCOUNTER — Encounter: Payer: Self-pay | Admitting: Family Medicine

## 2021-09-28 DIAGNOSIS — L309 Dermatitis, unspecified: Secondary | ICD-10-CM

## 2021-09-28 MED ORDER — BETAMETHASONE DIPROPIONATE AUG 0.05 % EX CREA: TOPICAL_CREAM | Freq: Two times a day (BID) | CUTANEOUS | 0 refills | Status: AC
